# Patient Record
Sex: Male | Born: 1960 | Race: White | Marital: Married | Smoking: Never smoker
Health system: Southern US, Community
[De-identification: ages and names within clinical notes are randomized; demographics above are authoritative.]

---

## 2014-04-13 ENCOUNTER — Ambulatory Visit (INDEPENDENT_AMBULATORY_CARE_PROVIDER_SITE_OTHER): Payer: Managed Care, Other (non HMO) | Admitting: Cardiology

## 2014-04-13 ENCOUNTER — Encounter: Payer: Self-pay | Admitting: Cardiology

## 2014-04-13 VITALS — BP 118/70 | HR 62 | Ht 72.0 in | Wt 171.1 lb

## 2014-04-13 DIAGNOSIS — R079 Chest pain, unspecified: Secondary | ICD-10-CM | POA: Insufficient documentation

## 2014-04-13 DIAGNOSIS — R9431 Abnormal electrocardiogram [ECG] [EKG]: Secondary | ICD-10-CM

## 2014-04-13 NOTE — Progress Notes (Signed)
   HPI The patient presents for evaluation of chest pain.  This happened last night after he was asleep.  He said that he rolled over and felt a discomfort under his sternum and around to his back.  It was a pressure.  It was moderate or more.  He had never had this before.  He sat up.  He was mildly diaphoretic.  However, he did not have any SOB, nausea or vomiting.  His neighbor who is an ER MD came over.  He took an ASA.  His symptoms started to go away after about 20 minutes.  It was completely gone within 30 minutes.  He has never had this before and it was not like previous reflux.  He is very active and bikes 40 miles at a time.  He has had no problems with this recently and he was biking as recently as yesterday.    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   Family History  Problem Relation Age of Onset  . CAD Father 109    History   Social History  . Marital Status: Married    Spouse Name: N/A    Number of Children: 2  . Years of Education: N/A   Occupational History  .     Social History Main Topics  . Smoking status: Never Smoker   . Smokeless tobacco: Not on file  . Alcohol Use: No  . Drug Use: Not on file  . Sexual Activity: Not on file   Other Topics Concern  . Not on file   Social History Narrative   [employer]    ROS:  As stated in the HPI and negative for all other systems.   PHYSICAL EXAM BP 118/70  Pulse 62  Ht 6' (1.829 m)  Wt 171 lb 1.9 oz (77.62 kg)  BMI 23.20 kg/m2 GENERAL:  Well appearing HEENT:  Pupils equal round and reactive, fundi not visualized, oral mucosa unremarkable NECK:  No jugular venous distention, waveform within normal limits, carotid upstroke brisk and symmetric, no bruits, no thyromegaly LYMPHATICS:  No cervical, inguinal adenopathy LUNGS:  Clear to auscultation bilaterally BACK:  No CVA tenderness CHEST:  Unremarkable HEART:  PMI not displaced or sustained,S1 and S2  within normal limits, no S3, no S4, no clicks, no rubs, no murmurs ABD:  Flat, positive bowel sounds normal in frequency in pitch, no bruits, no rebound, no guarding, no midline pulsatile mass, no hepatomegaly, no splenomegaly EXT:  2 plus pulses throughout, no edema, no cyanosis no clubbing SKIN:  No rashes no nodules NEURO:  Cranial nerves II through XII grossly intact, motor grossly intact throughout PSYCH:  Cognitively intact, oriented to person place and time  EKG:  Sinus bradycardia, rate 50, axis within limits, intervals within normal limits, no acute ST-T wave changes.  04/13/2014   ASSESSMENT AND PLAN  CHEST PAIN:  The pain was atypical.  However, he does have a family history as above.  Given this I will bring the patient back for a POET (Plain Old Exercise Test). This will allow me to screen for obstructive coronary disease, risk stratify and very importantly provide a prescription for exercise.  I would also suggest a coronary calcium score.

## 2014-04-13 NOTE — Patient Instructions (Signed)
The current medical regimen is effective;  continue present plan and medications.  Your physician has requested that you have an exercise tolerance test. For further information please visit [email or web address]. Please also follow instruction sheet, as given.  Your physician has requested that you have calcium score. Cardiac computed tomography (CT) is a painless test that uses an x-ray machine to take clear, detailed pictures of your heart. For further information please visit [email or web address]. Please follow instruction sheet as given.  Further follow up will be based on these results.

## 2014-04-14 ENCOUNTER — Ambulatory Visit (INDEPENDENT_AMBULATORY_CARE_PROVIDER_SITE_OTHER): Payer: Managed Care, Other (non HMO) | Admitting: Cardiology

## 2014-04-14 ENCOUNTER — Ambulatory Visit: Payer: Managed Care, Other (non HMO)

## 2014-04-14 DIAGNOSIS — R9431 Abnormal electrocardiogram [ECG] [EKG]: Secondary | ICD-10-CM

## 2014-04-14 DIAGNOSIS — R079 Chest pain, unspecified: Secondary | ICD-10-CM

## 2014-04-14 NOTE — Progress Notes (Signed)
Exercise Treadmill Test  Pre-Exercise Testing Evaluation Rhythm: sinus bradycardia  Rate: 56 bpm     Test  Exercise Tolerance Test Ordering MD: Marijo File, MD  Interpreting MD: Marijo File, MD  Unique Test No: 1  Treadmill:  1  Indication for ETT: chest pain - rule out ischemia  Contraindication to ETT: No   Stress Modality: exercise - treadmill  Cardiac Imaging Performed: non   Protocol: standard Bruce - maximal  Max BP:  199/95  Max MPHR (bpm):  166 85% MPR (bpm):  141  MPHR obtained (bpm):  173 % MPHR obtained:  104  Reached 85% MPHR (min:sec):  4:00 Total Exercise Time (min-sec):  10:30  Workload in METS:  15 Borg Scale: 15  Reason ETT Terminated:  desired heart rate attained    ST Segment Analysis At Rest: normal ST segments - no evidence of significant ST depression With Exercise: no evidence of significant ST depression  Other Information Arrhythmia:  No Angina during ETT:  absent (0) Quality of ETT:  diagnostic  ETT Interpretation:  normal - no evidence of ischemia by ST analysis  Comments: The patient had an excellent exercise tolerance.  There was no chest pain.  There was an appropriate level of dyspnea.  There were no arrhythmias, a normal heart rate response and normal BP response.  There were no ischemic ST T wave changes and a normal heart rate recovery.  Recommendations: Negative adequate ETT.  No further testing is indicated.  Based on the above I gave the patient a prescription for exercise.  Of note I did an accelerated Bruce Protocol.

## 2014-08-11 ENCOUNTER — Ambulatory Visit (INDEPENDENT_AMBULATORY_CARE_PROVIDER_SITE_OTHER)
Admission: RE | Admit: 2014-08-11 | Discharge: 2014-08-11 | Disposition: A | Discharge status: Home / Self Care | Payer: Self-pay | Source: Ambulatory Visit | Attending: Cardiology | Admitting: Cardiology

## 2014-08-11 DIAGNOSIS — R9431 Abnormal electrocardiogram [ECG] [EKG]: Secondary | ICD-10-CM

## 2014-08-11 DIAGNOSIS — R079 Chest pain, unspecified: Secondary | ICD-10-CM

## 2014-08-24 ENCOUNTER — Other Ambulatory Visit: Payer: Self-pay | Admitting: *Deleted

## 2015-09-15 IMAGING — CT CT HEART SCORING
1 of 3 series · 10 of 20 positions shown, 13 images · non-contrast
Comparison: None.

CLINICAL DATA: Risk stratification

EXAM:
Coronary Calcium Score
TECHNIQUE: The patient was scanned on a Siemens Sensation 16 slice scanner.
Axial non-contrast 3 mm slices were carried out through the heart.
The data set was analyzed on a dedicated work station and scored
using the Agatson method.

[Series 6: st thins for reformat · axial · 0.74mm/px · z∈[-267,-150]mm · 10 of 144 slices shown, 13 images]
[im 14/144  vessel]
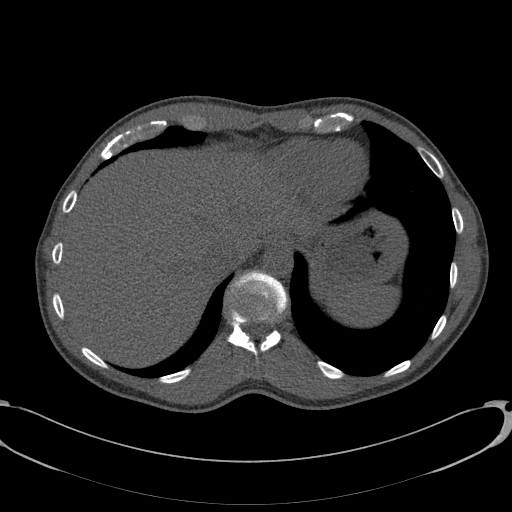
[im 14/144  lung]
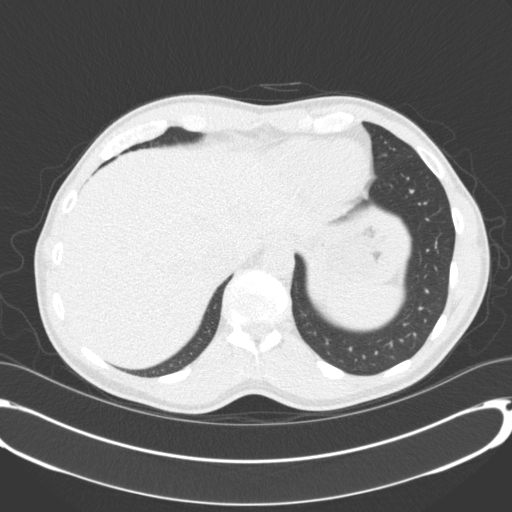
[im 27/144  vessel]
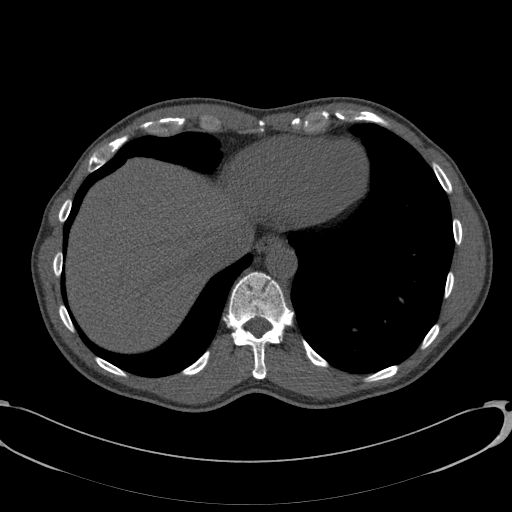
[im 40/144  vessel]
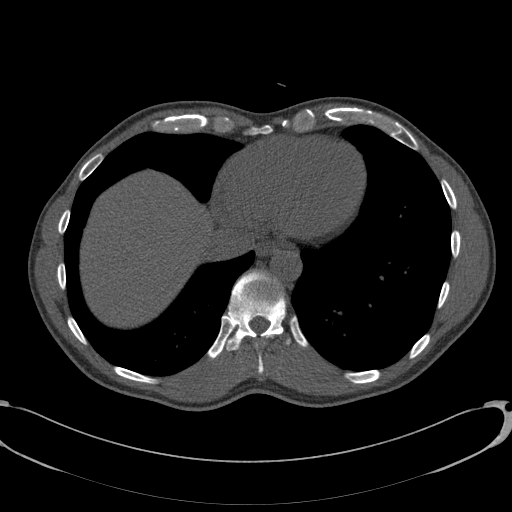
[im 53/144  vessel]
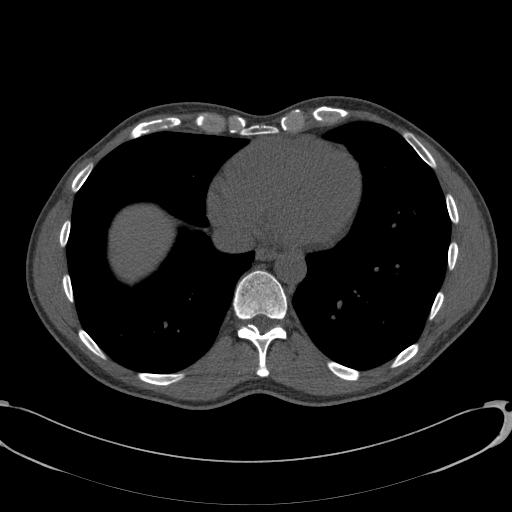
[im 66/144  vessel]
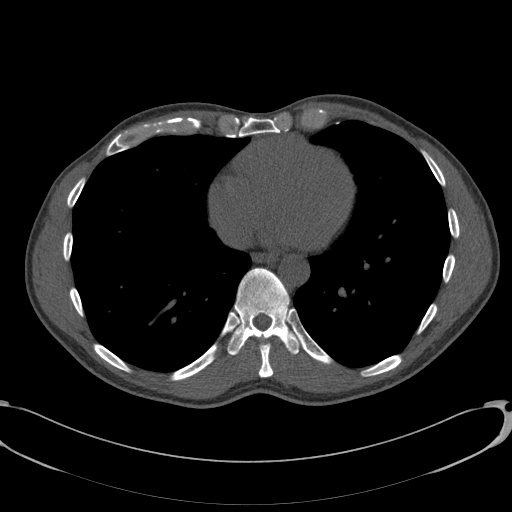
[im 66/144  lung]
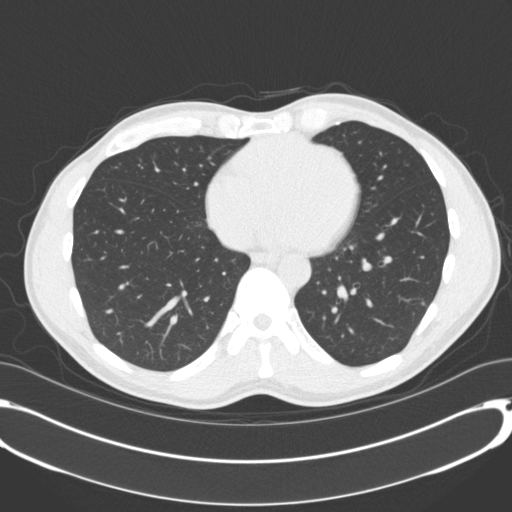
[im 79/144  vessel]
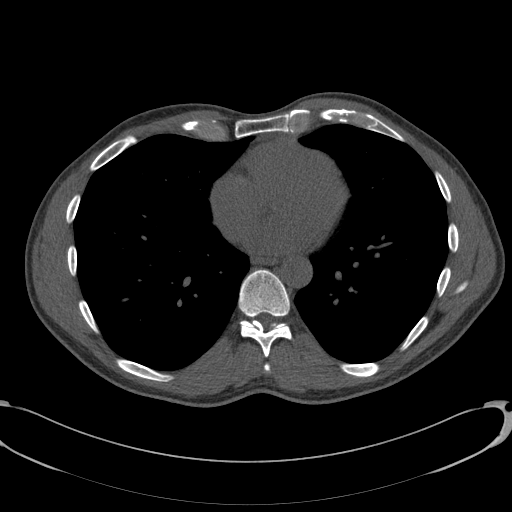
[im 92/144  vessel]
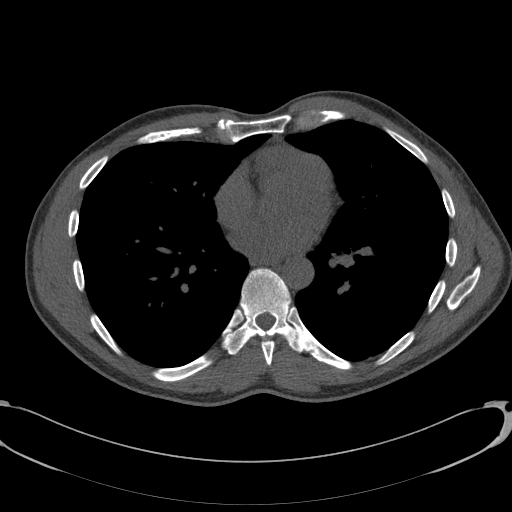
[im 105/144  vessel]
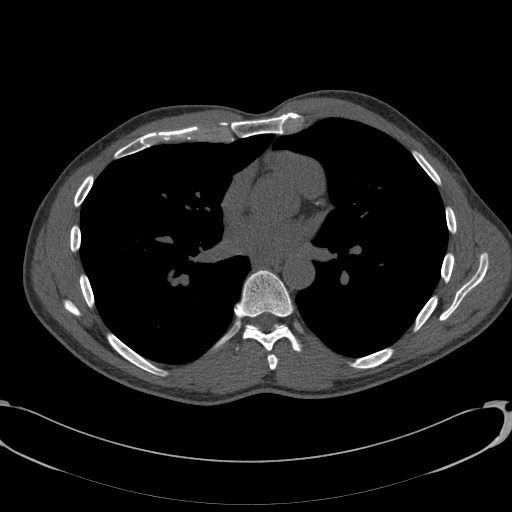
[im 118/144  vessel]
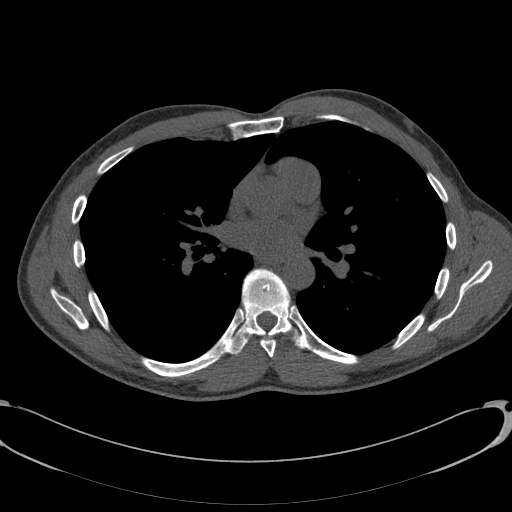
[im 118/144  lung]
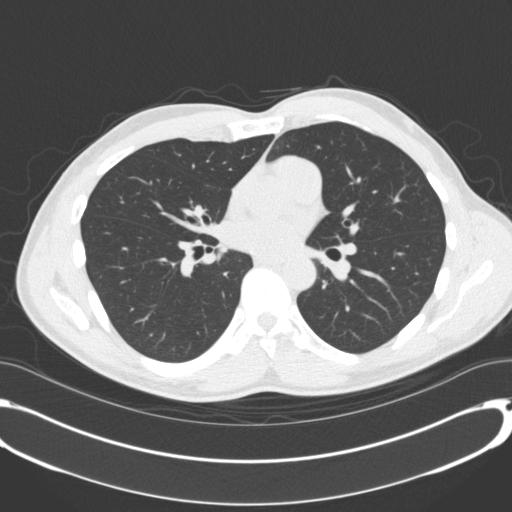
[im 131/144  vessel]
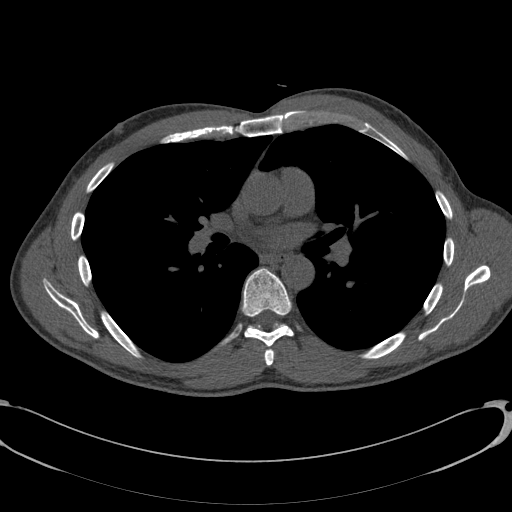

[10 of 20 positions shown; findings below may reference images not displayed]

FINDINGS: Non-cardiac: See separate report from [REDACTED].

Ascending Aorta:  Normal size.

Pericardium:  Normal, no effusion.

Coronary arteries:  Originating in normal position.
IMPRESSION: Coronary calcium score of 3.6 located in mid LAD. This was 46
percentile for age and sex matched control (males, 50-54 years old).

Danijela Madzar

EXAM:
OVER-READ INTERPRETATION  CT CHEST

The following report is an over-read performed by radiologist Dr.
over-read does not include interpretation of cardiac or coronary
anatomy or pathology. The coronary calcium score/coronary CTA
interpretation by the cardiologist is attached.
FINDINGS: 5 mm left lower lobe pulmonary nodule (image 27, series 4). There is
also a a 5 x 6 mm subpleural nodule in the periphery of the inferior
segment of the lingula (image 30 of series 4). No acute
consolidative airspace disease, pneumothorax, pleural effusion or
lymphadenopathy within the visualized portions of the thorax.
Visualized portions of the upper abdomen are unremarkable. There are
no aggressive appearing lytic or blastic lesions noted in the
visualized portions of the skeleton.
IMPRESSION: 1. 2 small pulmonary nodules in the left lung, largest of which
measures 5 x 6 mm in the inferior segment of the lingula. If the
patient is at high risk for bronchogenic carcinoma, follow-up chest
CT at 6-12 months is recommended. If the patient is at low risk for
bronchogenic carcinoma, follow-up chest CT at 12 months is
recommended. This recommendation follows the consensus statement:
Guidelines for Management of Small Pulmonary Nodules Detected on CT
Scans: A Statement from the [HOSPITAL] as published in

## 2019-01-07 DIAGNOSIS — R82998 Other abnormal findings in urine: Secondary | ICD-10-CM | POA: Diagnosis not present

## 2019-01-07 DIAGNOSIS — Z125 Encounter for screening for malignant neoplasm of prostate: Secondary | ICD-10-CM | POA: Diagnosis not present

## 2019-01-07 DIAGNOSIS — Z0000 Encounter for general adult medical examination without abnormal findings: Secondary | ICD-10-CM | POA: Diagnosis not present

## 2019-01-14 DIAGNOSIS — Z0000 Encounter for general adult medical examination without abnormal findings: Secondary | ICD-10-CM | POA: Diagnosis not present

## 2019-01-14 DIAGNOSIS — Z8042 Family history of malignant neoplasm of prostate: Secondary | ICD-10-CM | POA: Diagnosis not present

## 2019-01-14 DIAGNOSIS — Z8249 Family history of ischemic heart disease and other diseases of the circulatory system: Secondary | ICD-10-CM | POA: Diagnosis not present

## 2019-01-14 DIAGNOSIS — D126 Benign neoplasm of colon, unspecified: Secondary | ICD-10-CM | POA: Diagnosis not present

## 2019-01-17 DIAGNOSIS — Z1212 Encounter for screening for malignant neoplasm of rectum: Secondary | ICD-10-CM | POA: Diagnosis not present

## 2019-02-01 DIAGNOSIS — Z23 Encounter for immunization: Secondary | ICD-10-CM | POA: Diagnosis not present

## 2019-02-01 DIAGNOSIS — L820 Inflamed seborrheic keratosis: Secondary | ICD-10-CM | POA: Diagnosis not present

## 2019-02-01 DIAGNOSIS — L111 Transient acantholytic dermatosis [Grover]: Secondary | ICD-10-CM | POA: Diagnosis not present
